# Patient Record
Sex: Female | Born: 1985 | Race: White | Hispanic: No | Marital: Single
Health system: Southern US, Community
[De-identification: ages and names within clinical notes are randomized; demographics above are authoritative.]

---

## 2011-07-18 ENCOUNTER — Emergency Department: Payer: Self-pay | Admitting: Emergency Medicine

## 2011-07-18 LAB — COMPREHENSIVE METABOLIC PANEL
Albumin: 3.6 g/dL (ref 3.4–5.0)
Alkaline Phosphatase: 91 U/L (ref 50–136)
Anion Gap: 8 (ref 7–16)
BUN: 18 mg/dL (ref 7–18)
Bilirubin,Total: 0.5 mg/dL (ref 0.2–1.0)
EGFR (Non-African Amer.): >60
Glucose: 107 mg/dL — ABNORMAL HIGH (ref 65–99)
Osmolality: 284 (ref 275–301)
Potassium: 4.1 mmol/L (ref 3.5–5.1)
SGOT(AST): 59 U/L — ABNORMAL HIGH (ref 15–37)
SGPT (ALT): 46 U/L
Sodium: 141 mmol/L (ref 136–145)

## 2011-07-18 LAB — URINALYSIS, COMPLETE
Blood: NEGATIVE
Ketone: NEGATIVE
Nitrite: NEGATIVE
Ph: 5 (ref 4.5–8.0)
Protein: NEGATIVE
RBC,UR: 2 /HPF (ref 0–5)
Specific Gravity: 1.018 (ref 1.003–1.030)
Squamous Epithelial: 2
WBC UR: 5 /HPF (ref 0–5)

## 2011-07-18 LAB — CBC
HCT: 43.2 % (ref 35.0–47.0)
HGB: 14.3 g/dL (ref 12.0–16.0)
MCV: 87 fL (ref 80–100)
Platelet: 236 10*3/uL (ref 150–440)
WBC: 10.2 10*3/uL (ref 3.6–11.0)

## 2011-07-18 LAB — PREGNANCY, URINE: Pregnancy Test, Urine: NEGATIVE m[IU]/mL

## 2011-07-18 LAB — LIPASE, BLOOD: Lipase: 75 U/L (ref 73–393)

## 2011-08-27 ENCOUNTER — Ambulatory Visit: Payer: Self-pay | Admitting: Surgery

## 2011-08-27 LAB — CBC WITH DIFFERENTIAL/PLATELET
Basophil #: 0.1 10*3/uL (ref 0.0–0.1)
Basophil %: 0.9 %
Eosinophil #: 0.1 10*3/uL (ref 0.0–0.7)
Eosinophil %: 2.1 %
HCT: 43 % (ref 35.0–47.0)
HGB: 14.2 g/dL (ref 12.0–16.0)
Lymphocyte %: 33.2 %
MCH: 28.9 pg (ref 26.0–34.0)
MCHC: 32.9 g/dL (ref 32.0–36.0)
MCV: 88 fL (ref 80–100)
Neutrophil %: 54.2 %
Platelet: 216 10*3/uL (ref 150–440)
RBC: 4.9 10*6/uL (ref 3.80–5.20)
RDW: 13.8 % (ref 11.5–14.5)
WBC: 5.9 10*3/uL (ref 3.6–11.0)

## 2011-08-27 LAB — BASIC METABOLIC PANEL
Anion Gap: 9 (ref 7–16)
Calcium, Total: 9.1 mg/dL (ref 8.5–10.1)
Co2: 25 mmol/L (ref 21–32)
Glucose: 99 mg/dL (ref 65–99)
Osmolality: 275 (ref 275–301)
Potassium: 4.3 mmol/L (ref 3.5–5.1)
Sodium: 138 mmol/L (ref 136–145)

## 2011-08-27 LAB — HEPATIC FUNCTION PANEL A (ARMC)
Albumin: 3.6 g/dL (ref 3.4–5.0)
Bilirubin,Total: 0.6 mg/dL (ref 0.2–1.0)
Total Protein: 7.7 g/dL (ref 6.4–8.2)

## 2011-08-29 ENCOUNTER — Ambulatory Visit: Payer: Self-pay | Admitting: Surgery

## 2011-08-31 LAB — PATHOLOGY REPORT

## 2013-10-24 IMAGING — US ABDOMEN ULTRASOUND LIMITED
1 series · 14 of 25 positions shown · non-contrast
Comparison: none

REASON FOR EXAM: RUQ/epigastric pain, vomiting
COMMENTS:   Body Site: GB and Fossa, CBD, Head of Pancreas

[Series 1: abdomen ultrasound limited · 0.39mm/px · 14 of 54 slices shown]
[im 1/54]
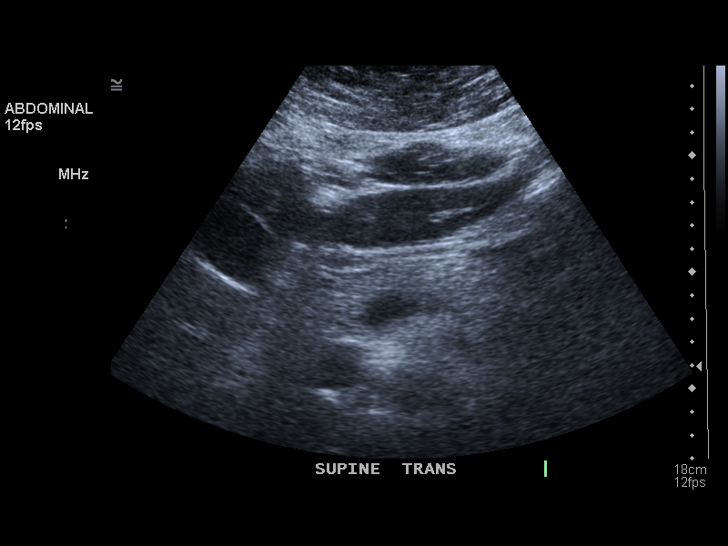
[im 5/54]
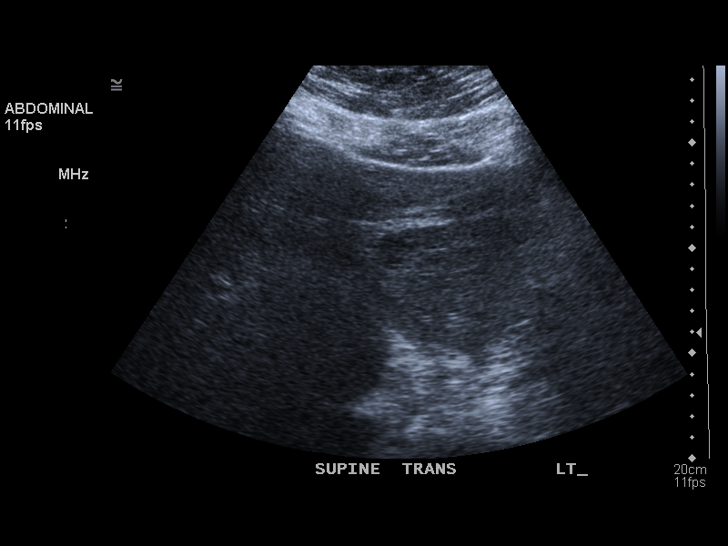
[im 9/54]
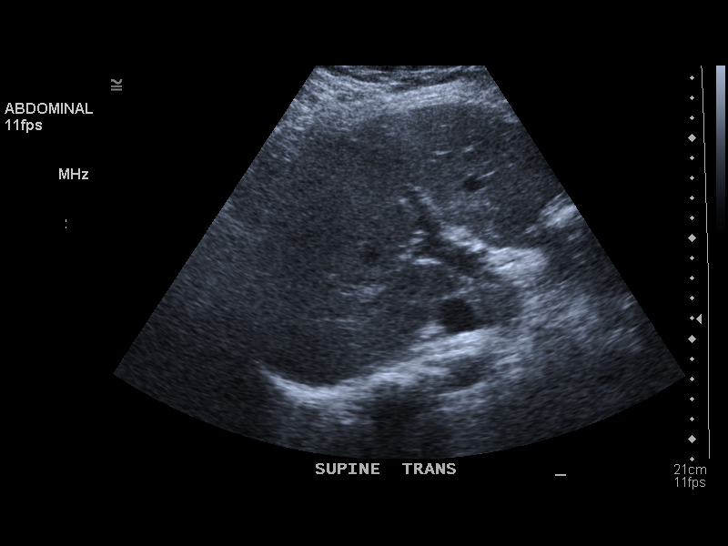
[im 14/54]
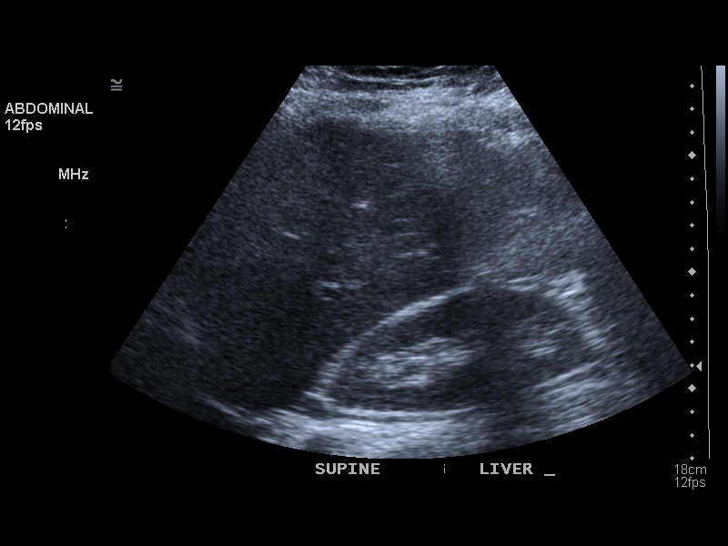
[im 18/54]
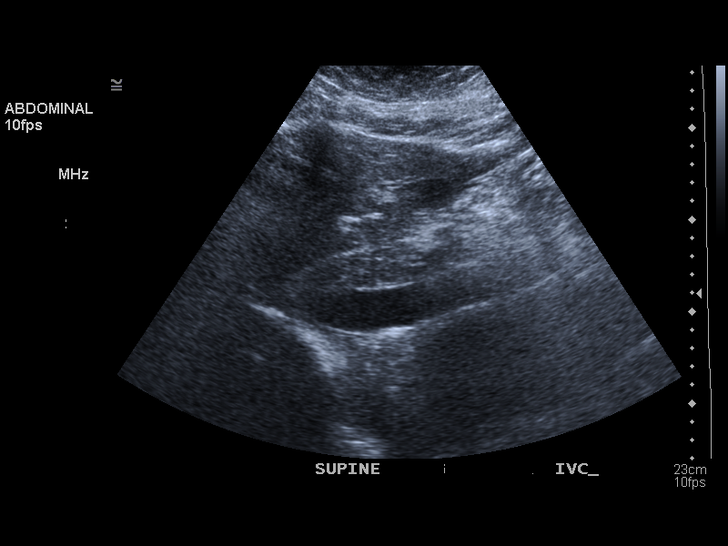
[im 20/54]
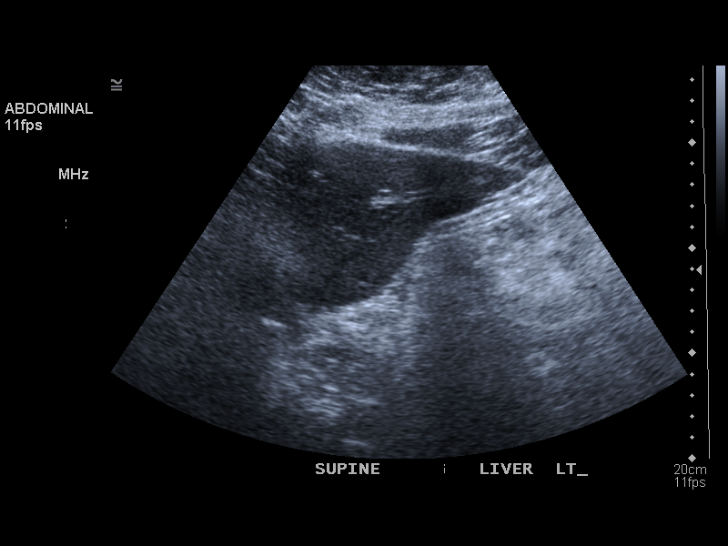
[im 25/54]
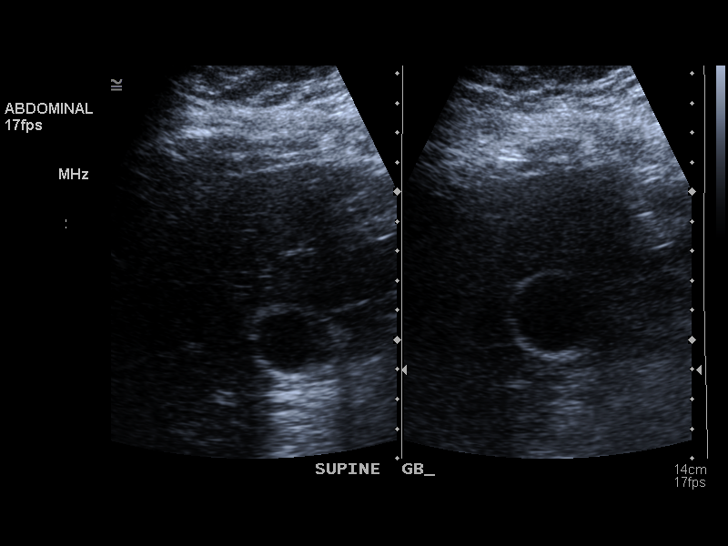
[im 29/54]
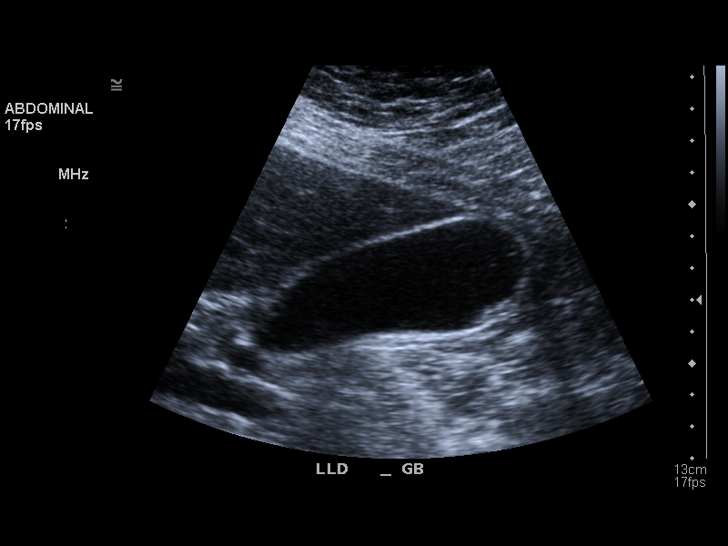
[im 34/54]
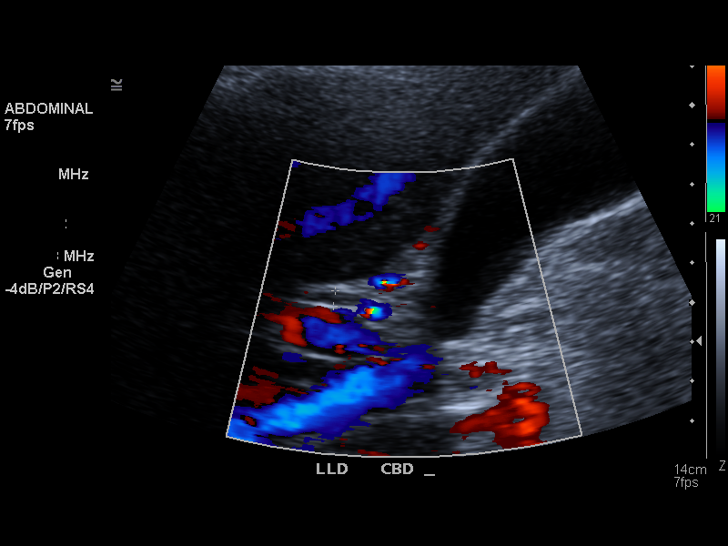
[im 36/54]
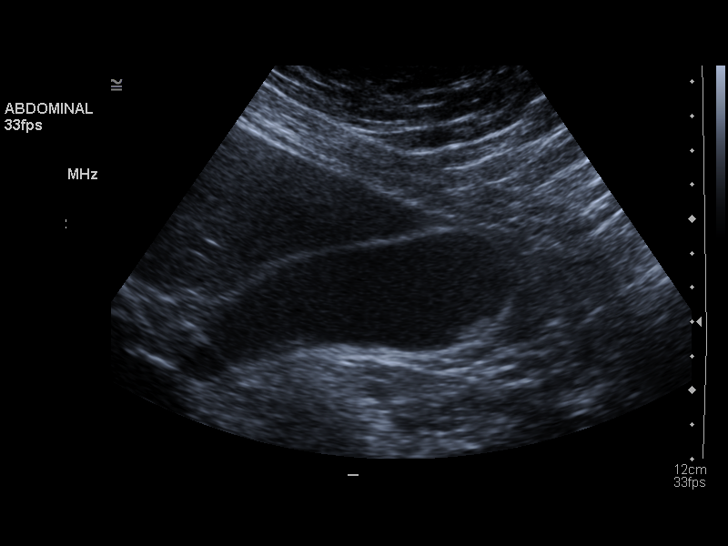
[im 40/54]
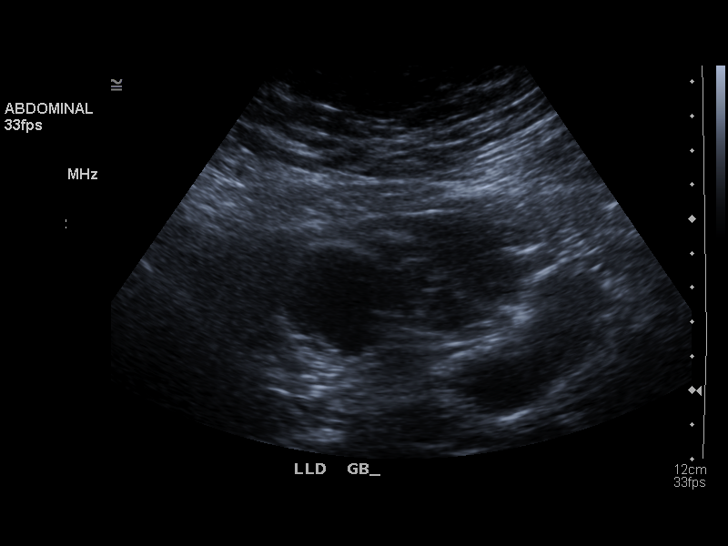
[im 45/54]
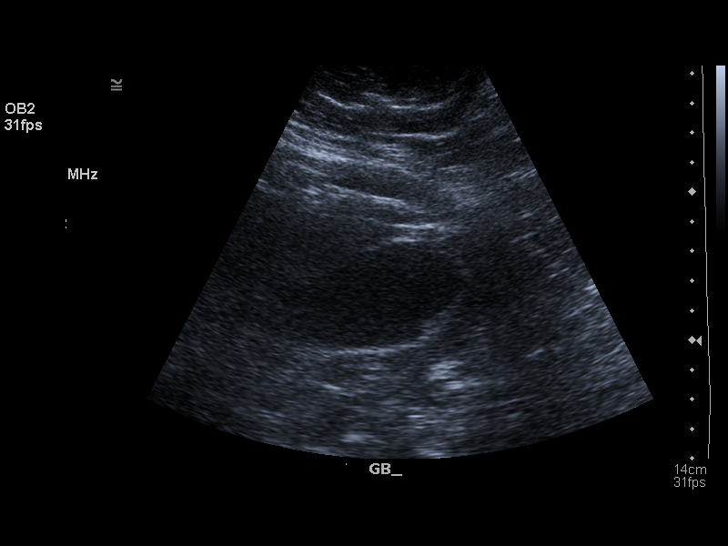
[im 49/54]
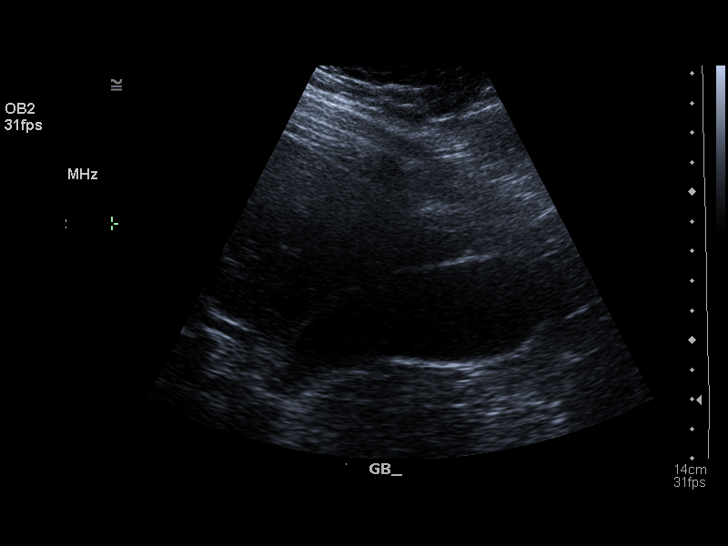
[im 54/54]
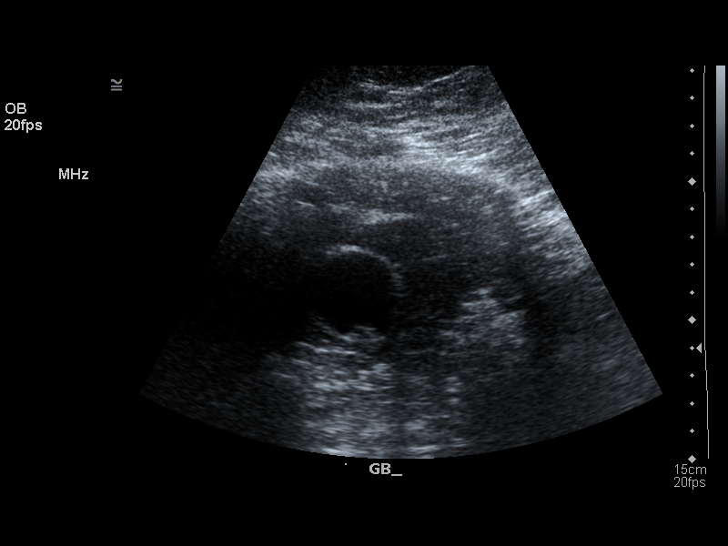

[14 of 25 positions shown; findings below may reference images not displayed]

PROCEDURE:     US  - US ABDOMEN LIMITED SURVEY  - July 18, 2011 [DATE]

RESULT:     There are noted a few mobile echogenic foci in the gallbladder.
There is no associated shadowing which suggests the findings are secondary
to sludge. There is no thickening of the gallbladder wall which measures
mm in thickness. No pericholecystic fluid is seen. The common bile duct
measures 4 mm in diameter, which is within normal limits. The head and body
of the pancreas are visualized and are normal in appearance. The visualized
portion of the liver shows no significant abnormalities. There are no
dilated intrahepatic bile ducts. No ascites about the liver is seen.
IMPRESSION: 1. There is noted mobile nonshadowing echogenic material in the gallbladder,
most compatible with sludge.
2. There is no thickening of the gallbladder wall and no pericholecystic
fluid is seen.

## 2014-08-01 NOTE — Op Note (Signed)
PATIENT NAME:  Debbie Huynh, Debbie Huynh MR#:  [id number] DATE OF BIRTH:  1986-03-10  DATE OF PROCEDURE:  08/29/2011  OPERATION PERFORMED: Laparoscopic cholecystectomy.   PREOPERATIVE DIAGNOSIS: Symptomatic cholelithiasis.   POSTOPERATIVE DIAGNOSIS: Symptomatic cholelithiasis.   SURGEON: Claude MangesWilliam F. Mahum Betten, MD  ANESTHESIA: General.   PROCEDURE IN DETAIL: The patient was placed supine on the Operating Room table and prepped and draped in the usual sterile fashion. A 15 mmHg CO2 pneumoperitoneum was created via a Veress needle in the infraumbilical midline and this was converted to a 5 mm trocar and a 30 degrees angled laparoscope. Remaining trocars were placed under direct visualization. The fundus of the gallbladder was retracted superiorly and ventrally. The infundibulum of the gallbladder was retracted laterally to open up the triangle of Calot. There was fat within the triangle of Calot but the distances were fairly long. The common bile duct and common hepatic duct could not be visualized. The dissection was undertaken at the junction at the infundibulum and the cystic duct and the cystic duct here was triply clipped and divided. The cystic artery was identified going directly into the gallbladder behind the cystic duct and this was quite large but was determined not to be in aberrant or normally placed right hepatic artery but rather truly a cystic artery. This was triply clipped and divided as well. The gallbladder was then removed from the liver bed with electrocautery and extracted from the abdomen via the epigastric port. This port site fascia was closed with a single 0 Vicryl suture using the laparoscopic puncture closure device. The right upper quadrant was irrigated with a small amount of warm normal saline and this was suctioned clear and the peritoneum was desufflated and decannulated and all four skin sites were closed with subcuticular 5-0 Monocryl and suture strips. The patient tolerated the  procedure well. There were no complications.    ____________________________ Claude MangesWilliam F. Torrance Frech, MD [facility] D: 08/29/2011 10:22:17 ET T: 08/29/2011 11:18:24 ET  JOB#: [id number] cc: Claude MangesWilliam F. Dera Vanaken, MD, <Dictator> Claude MangesWILLIAM F Azaya Goedde MD ELECTRONICALLY SIGNED 08/30/2011 7:54
